# Patient Record
Sex: Female | Born: 1965 | Race: White | Marital: Married
Health system: Southern US, Community
[De-identification: ages and names within clinical notes are randomized; demographics above are authoritative.]

## PROBLEM LIST (undated history)

## (undated) DIAGNOSIS — E119 Type 2 diabetes mellitus without complications: Secondary | ICD-10-CM

## (undated) DIAGNOSIS — E785 Hyperlipidemia, unspecified: Secondary | ICD-10-CM

## (undated) DIAGNOSIS — I10 Essential (primary) hypertension: Secondary | ICD-10-CM

## (undated) DIAGNOSIS — N200 Calculus of kidney: Secondary | ICD-10-CM

## (undated) DIAGNOSIS — H269 Unspecified cataract: Secondary | ICD-10-CM

## (undated) HISTORY — DX: Hyperlipidemia, unspecified: E78.5

## (undated) HISTORY — DX: Essential (primary) hypertension: I10

## (undated) HISTORY — DX: Type 2 diabetes mellitus without complications: E11.9

## (undated) HISTORY — DX: Unspecified cataract: H26.9

## (undated) HISTORY — DX: Calculus of kidney: N20.0

---

## 2013-06-03 ENCOUNTER — Ambulatory Visit
Admission: RE | Admit: 2013-06-03 | Discharge: 2013-06-03 | Disposition: A | Discharge status: Home / Self Care | Payer: 59 | Source: Ambulatory Visit | Attending: Family Medicine | Admitting: Family Medicine

## 2013-06-03 ENCOUNTER — Other Ambulatory Visit: Payer: Self-pay | Admitting: Family Medicine

## 2013-06-03 DIAGNOSIS — M79604 Pain in right leg: Secondary | ICD-10-CM

## 2013-06-03 DIAGNOSIS — M79651 Pain in right thigh: Secondary | ICD-10-CM

## 2014-10-07 IMAGING — CR DG LUMBAR SPINE 2-3V
3 series · 3 of 3 positions shown · non-contrast
Comparison: CT 01/14/2013

CLINICAL DATA: Low back and right lower extremity pain

EXAM:
LUMBAR SPINE - 2-3 VIEW

[view not recorded (1 of 3)]
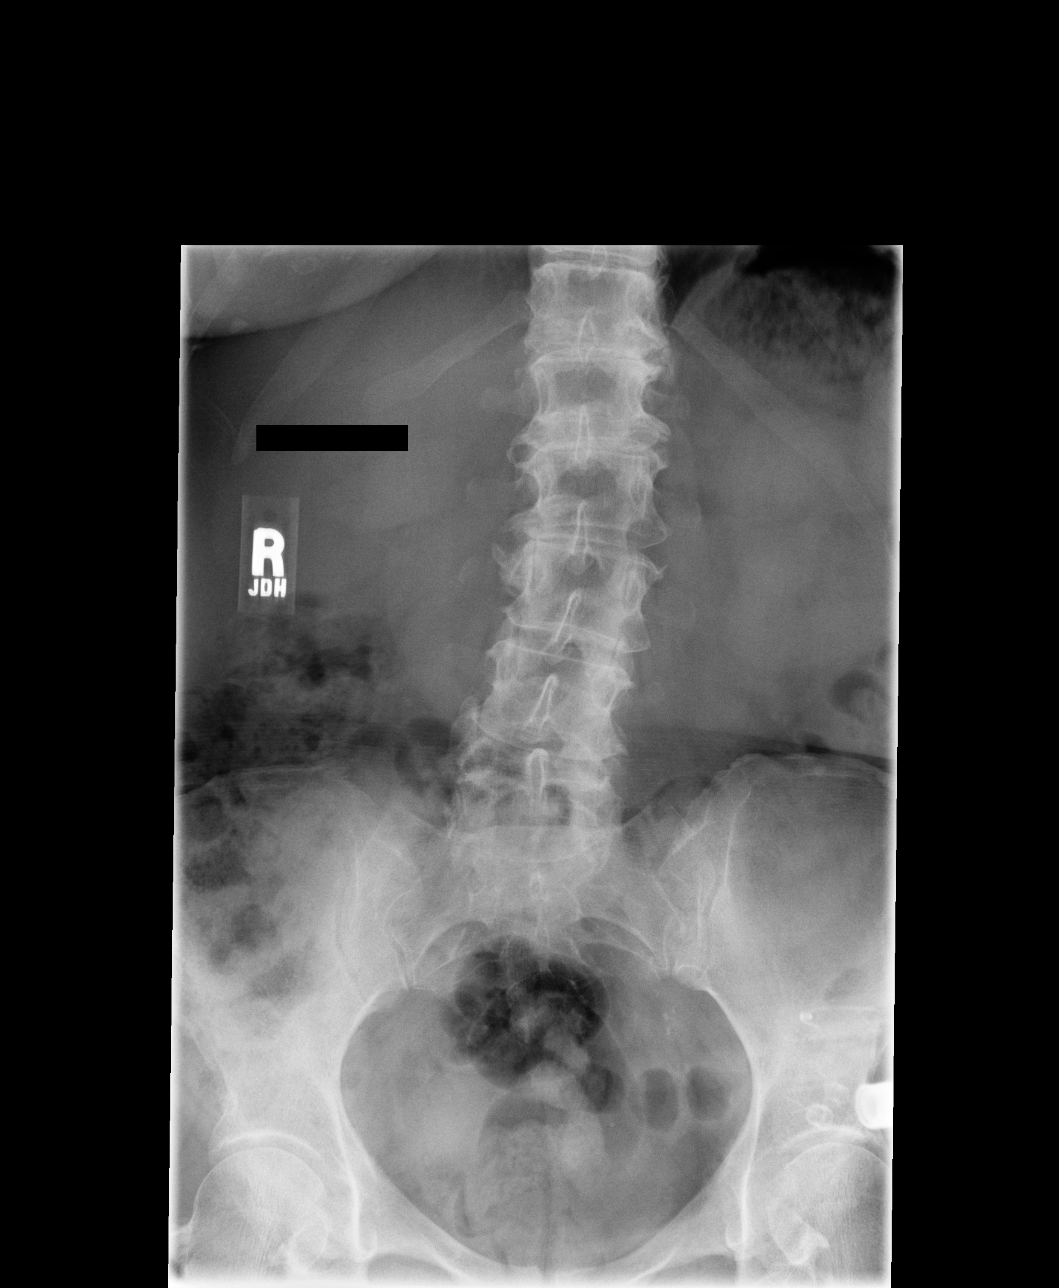

[view not recorded (2 of 3)]
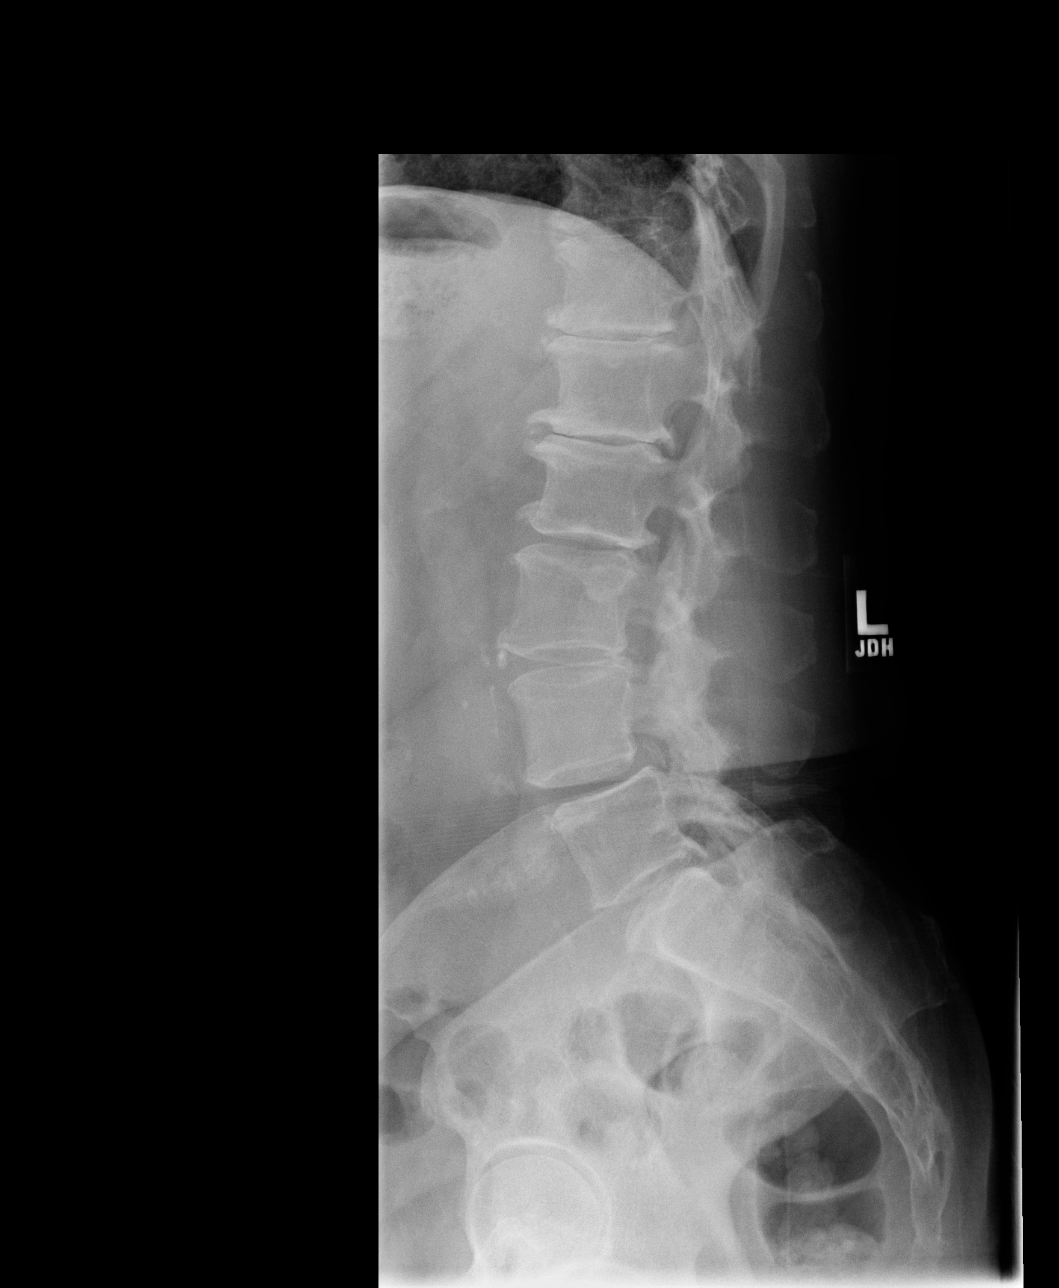

[view not recorded (3 of 3)]
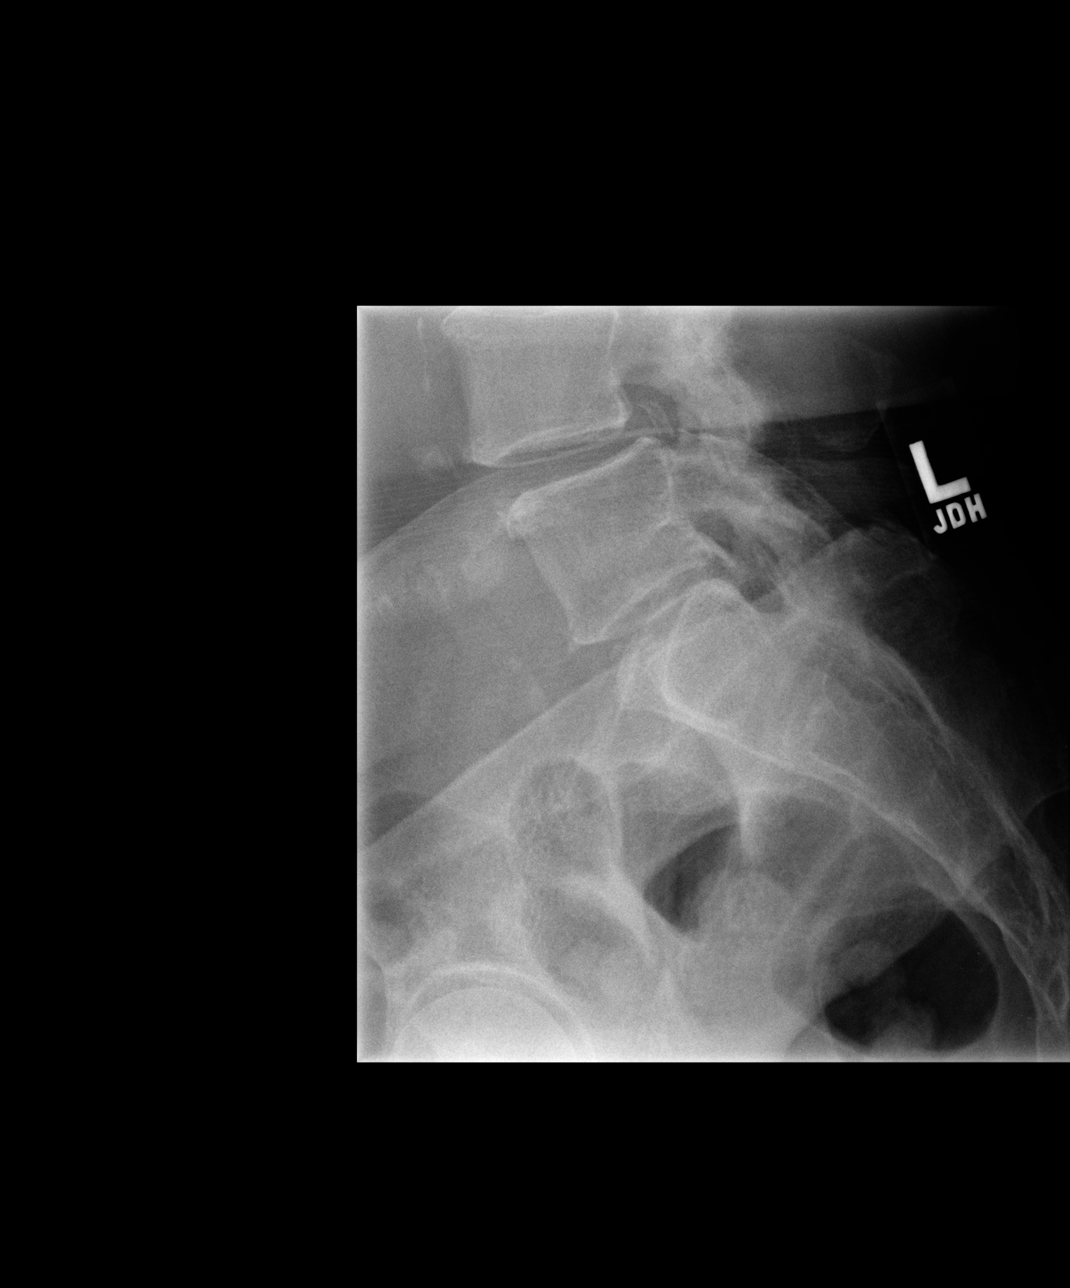

[3 of 3 positions shown; findings below may reference images not displayed]

FINDINGS: There is a mild lumbar levoscoliosis apex L2-3. Narrowing of
interspaces T12-L1 with small endplate spurs. Negative for fracture.
Approximately 4 mm anterolisthesis L4-5. Patchy aortic
calcifications without suggestion of aneurysm.
IMPRESSION: 1. Negative for fracture or other acute bone abnormality.
2. Multilevel degenerative changes with grade 1 anterolisthesis
L4-5.
